# Patient Record
Sex: Female | Born: 1960 | Race: White | Hispanic: No | Marital: Married
Health system: Southern US, Community
[De-identification: ages and names within clinical notes are randomized; demographics above are authoritative.]

## PROBLEM LIST (undated history)

## (undated) DIAGNOSIS — I10 Essential (primary) hypertension: Secondary | ICD-10-CM

## (undated) HISTORY — PX: ABDOMINAL HYSTERECTOMY: SHX81

---

## 2004-11-01 ENCOUNTER — Ambulatory Visit: Payer: Self-pay | Admitting: Obstetrics and Gynecology

## 2005-11-04 ENCOUNTER — Ambulatory Visit: Payer: Self-pay | Admitting: Obstetrics and Gynecology

## 2005-11-07 ENCOUNTER — Ambulatory Visit: Payer: Self-pay | Admitting: Obstetrics and Gynecology

## 2006-11-06 ENCOUNTER — Ambulatory Visit: Payer: Self-pay | Admitting: Obstetrics and Gynecology

## 2007-11-10 ENCOUNTER — Ambulatory Visit: Payer: Self-pay | Admitting: Obstetrics and Gynecology

## 2007-11-13 ENCOUNTER — Ambulatory Visit: Payer: Self-pay | Admitting: Obstetrics and Gynecology

## 2008-11-11 ENCOUNTER — Ambulatory Visit: Payer: Self-pay | Admitting: Obstetrics and Gynecology

## 2008-12-20 ENCOUNTER — Ambulatory Visit: Payer: Self-pay | Admitting: Surgery

## 2009-11-22 ENCOUNTER — Ambulatory Visit: Payer: Self-pay | Admitting: Obstetrics and Gynecology

## 2010-04-20 ENCOUNTER — Ambulatory Visit: Payer: Self-pay | Admitting: General Practice

## 2010-04-24 LAB — PATHOLOGY REPORT

## 2010-11-28 ENCOUNTER — Ambulatory Visit: Payer: Self-pay | Admitting: Obstetrics and Gynecology

## 2011-11-29 ENCOUNTER — Ambulatory Visit: Payer: Self-pay | Admitting: Obstetrics and Gynecology

## 2012-12-10 ENCOUNTER — Ambulatory Visit: Payer: Self-pay | Admitting: Obstetrics and Gynecology

## 2013-01-07 ENCOUNTER — Ambulatory Visit: Payer: Self-pay | Admitting: Obstetrics and Gynecology

## 2013-03-08 ENCOUNTER — Ambulatory Visit: Payer: Self-pay | Admitting: Gastroenterology

## 2013-12-15 ENCOUNTER — Ambulatory Visit: Payer: Self-pay | Admitting: Obstetrics and Gynecology

## 2014-12-28 ENCOUNTER — Other Ambulatory Visit: Payer: Self-pay | Admitting: Obstetrics and Gynecology

## 2014-12-28 DIAGNOSIS — Z1231 Encounter for screening mammogram for malignant neoplasm of breast: Secondary | ICD-10-CM

## 2015-01-12 ENCOUNTER — Ambulatory Visit
Admission: RE | Admit: 2015-01-12 | Discharge: 2015-01-12 | Disposition: A | Discharge status: Home / Self Care | Payer: 59 | Source: Ambulatory Visit | Attending: Obstetrics and Gynecology | Admitting: Obstetrics and Gynecology

## 2015-01-12 DIAGNOSIS — Z1231 Encounter for screening mammogram for malignant neoplasm of breast: Secondary | ICD-10-CM | POA: Diagnosis present

## 2015-08-31 DIAGNOSIS — K279 Peptic ulcer, site unspecified, unspecified as acute or chronic, without hemorrhage or perforation: Secondary | ICD-10-CM | POA: Insufficient documentation

## 2015-08-31 DIAGNOSIS — Z9884 Bariatric surgery status: Secondary | ICD-10-CM | POA: Insufficient documentation

## 2015-11-07 DIAGNOSIS — Z9884 Bariatric surgery status: Secondary | ICD-10-CM | POA: Insufficient documentation

## 2015-11-07 DIAGNOSIS — K639 Disease of intestine, unspecified: Secondary | ICD-10-CM | POA: Insufficient documentation

## 2015-11-07 DIAGNOSIS — R140 Abdominal distension (gaseous): Secondary | ICD-10-CM | POA: Insufficient documentation

## 2016-01-02 ENCOUNTER — Other Ambulatory Visit: Payer: Self-pay | Admitting: Obstetrics and Gynecology

## 2016-01-02 DIAGNOSIS — Z1231 Encounter for screening mammogram for malignant neoplasm of breast: Secondary | ICD-10-CM

## 2016-02-07 ENCOUNTER — Ambulatory Visit
Admission: RE | Admit: 2016-02-07 | Discharge: 2016-02-07 | Disposition: A | Discharge status: Home / Self Care | Payer: 59 | Source: Ambulatory Visit | Attending: Obstetrics and Gynecology | Admitting: Obstetrics and Gynecology

## 2016-02-07 DIAGNOSIS — Z1231 Encounter for screening mammogram for malignant neoplasm of breast: Secondary | ICD-10-CM | POA: Diagnosis present

## 2016-08-07 ENCOUNTER — Encounter: Payer: Self-pay | Admitting: Podiatry

## 2016-08-07 ENCOUNTER — Ambulatory Visit (INDEPENDENT_AMBULATORY_CARE_PROVIDER_SITE_OTHER): Payer: 59

## 2016-08-07 ENCOUNTER — Ambulatory Visit (INDEPENDENT_AMBULATORY_CARE_PROVIDER_SITE_OTHER): Payer: 59 | Admitting: Podiatry

## 2016-08-07 DIAGNOSIS — M779 Enthesopathy, unspecified: Principal | ICD-10-CM

## 2016-08-07 DIAGNOSIS — M778 Other enthesopathies, not elsewhere classified: Secondary | ICD-10-CM

## 2016-08-07 DIAGNOSIS — M7751 Other enthesopathy of right foot: Secondary | ICD-10-CM

## 2016-08-07 DIAGNOSIS — Q828 Other specified congenital malformations of skin: Secondary | ICD-10-CM | POA: Diagnosis not present

## 2016-08-07 NOTE — Progress Notes (Signed)
   Subjective:    Patient ID: Hannah Freeman, female    DOB: 1960/07/26, 56 y.o.   MRN: [id number]  HPI: She presents today with a chief complaint of a painful plantar lateral right foot. She states that tiny callused area for a couple of months this been present there she's tried pulling at the area with tweezers thinking it was something in the foot possibly now she states that she's having pain along the entire lateral aspect of the foot she does a lot of walking for exercise and white have this taken care of.    Review of Systems  All other systems reviewed and are negative.      Objective:   Physical Exam: Vital signs are stable she is alert and oriented 3. Pulses are palpable. Neurologic symptoms from his intact. Deep tendon reflexes are intact. Strength was 5 over 5 dorsiflexion plantar flexion inverters everters all his with sutures intact. Orthopedic evaluation was resulted was distal to the ankle full range of motion without crepitation. Flexible pes planus is noted bilateral. Prominent fifth metatarsal bases bilateral. Fluctuance beneath the fifth metatarsal base of the right foot. She also has a reactive hyperkeratotic in this area which is painful on palpation. No open lesions or wounds are noted. Radiographs taken today do not demonstrate any type of osseus abnormalities other than of the pes planus.        Assessment & Plan:  Assessment: Porokeratosis bursitis and possible peroneal tendinitis right.  Plan: I injected the plantar bursa today with dexamethasone and local anesthetic debrided the reactive hyperkeratotic lesion and placed Cantharone under occlusion to be washed off thoroughly tomorrow. I'll follow-up with her in 1 month if she still painful.

## 2016-09-09 ENCOUNTER — Ambulatory Visit (INDEPENDENT_AMBULATORY_CARE_PROVIDER_SITE_OTHER): Payer: 59 | Admitting: Podiatry

## 2016-09-09 DIAGNOSIS — Q828 Other specified congenital malformations of skin: Secondary | ICD-10-CM | POA: Diagnosis not present

## 2016-09-09 NOTE — Progress Notes (Signed)
She resists today for follow-up of capsulitis to the plantar lateral and dorsolateral aspect of the right foot. She states this still hurts some and is feeling much better and the injection really helps that area.  Objective: Vital signs are stable she is alert and oriented 3 she has no pain on palpation or on range of motion of the fourth fifth met cuboid articulation of the right foot. She still retains superficial porokeratotic lesion which we placed chemicals on last visit in hopes to help remove the lesion. I was able to do nucleate the lesion today completely. This felt 100% better to her.  Assessment: Hypertrophic fifth metatarsal base right foot. Capsulitis fourth fifth metatarsocuboid articulation. An porokeratosis which has resolved.  Plan: Debrided the area today follow up with me as needed.

## 2017-01-03 DIAGNOSIS — R001 Bradycardia, unspecified: Secondary | ICD-10-CM | POA: Insufficient documentation

## 2017-01-03 DIAGNOSIS — R002 Palpitations: Secondary | ICD-10-CM | POA: Insufficient documentation

## 2017-01-03 DIAGNOSIS — R0789 Other chest pain: Secondary | ICD-10-CM | POA: Insufficient documentation

## 2017-01-06 ENCOUNTER — Other Ambulatory Visit: Payer: Self-pay | Admitting: Obstetrics and Gynecology

## 2017-01-06 DIAGNOSIS — N393 Stress incontinence (female) (male): Secondary | ICD-10-CM | POA: Insufficient documentation

## 2017-01-06 DIAGNOSIS — Z1231 Encounter for screening mammogram for malignant neoplasm of breast: Secondary | ICD-10-CM

## 2017-02-10 ENCOUNTER — Ambulatory Visit
Admission: RE | Admit: 2017-02-10 | Discharge: 2017-02-10 | Disposition: A | Discharge status: Home / Self Care | Payer: 59 | Source: Ambulatory Visit | Attending: Obstetrics and Gynecology | Admitting: Obstetrics and Gynecology

## 2017-02-10 DIAGNOSIS — Z1231 Encounter for screening mammogram for malignant neoplasm of breast: Secondary | ICD-10-CM

## 2018-01-05 ENCOUNTER — Other Ambulatory Visit: Payer: Self-pay | Admitting: Internal Medicine

## 2018-01-05 DIAGNOSIS — Z1231 Encounter for screening mammogram for malignant neoplasm of breast: Secondary | ICD-10-CM

## 2018-02-11 ENCOUNTER — Ambulatory Visit
Admission: RE | Admit: 2018-02-11 | Discharge: 2018-02-11 | Disposition: A | Discharge status: Home / Self Care | Payer: Managed Care, Other (non HMO) | Source: Ambulatory Visit | Attending: Internal Medicine | Admitting: Internal Medicine

## 2018-02-11 DIAGNOSIS — Z1231 Encounter for screening mammogram for malignant neoplasm of breast: Secondary | ICD-10-CM | POA: Diagnosis not present

## 2019-01-04 ENCOUNTER — Other Ambulatory Visit: Payer: Self-pay | Admitting: Internal Medicine

## 2019-01-04 DIAGNOSIS — Z1231 Encounter for screening mammogram for malignant neoplasm of breast: Secondary | ICD-10-CM

## 2019-01-26 DIAGNOSIS — I10 Essential (primary) hypertension: Secondary | ICD-10-CM | POA: Insufficient documentation

## 2019-02-15 ENCOUNTER — Ambulatory Visit
Admission: RE | Admit: 2019-02-15 | Discharge: 2019-02-15 | Disposition: A | Discharge status: Home / Self Care | Payer: Managed Care, Other (non HMO) | Source: Ambulatory Visit | Attending: Internal Medicine | Admitting: Internal Medicine

## 2019-02-15 DIAGNOSIS — Z1231 Encounter for screening mammogram for malignant neoplasm of breast: Secondary | ICD-10-CM | POA: Diagnosis not present

## 2019-03-24 ENCOUNTER — Other Ambulatory Visit: Payer: Self-pay

## 2019-03-24 ENCOUNTER — Encounter: Payer: Self-pay | Admitting: Podiatry

## 2019-03-24 ENCOUNTER — Ambulatory Visit: Payer: Managed Care, Other (non HMO) | Admitting: Podiatry

## 2019-03-24 ENCOUNTER — Ambulatory Visit (INDEPENDENT_AMBULATORY_CARE_PROVIDER_SITE_OTHER): Payer: Managed Care, Other (non HMO)

## 2019-03-24 DIAGNOSIS — Q828 Other specified congenital malformations of skin: Secondary | ICD-10-CM

## 2019-03-24 DIAGNOSIS — M19071 Primary osteoarthritis, right ankle and foot: Secondary | ICD-10-CM | POA: Diagnosis not present

## 2019-03-24 DIAGNOSIS — M6740 Ganglion, unspecified site: Secondary | ICD-10-CM

## 2019-03-24 NOTE — Progress Notes (Signed)
She presents today after having not seen her in a couple of years with a chief complaint of a painful blister to the dorsal aspect of the right toe.  She states that is just right behind the toenail.  She noticed it about 3 months ago and since then it hurts to wear closed toed shoes.  She has gets achy at times and she also has a couple painful calluses on the plantar aspect of the right foot.  Objective: Vital signs are stable alert and oriented x3.  Pulses are palpable.  There is no erythema edema cellulitis drainage or odor reactive hyper keratomas porokeratotic lesion subfifth metatarsal base and subfifth metatarsal head on the right foot.  No open lesions or wounds are noted.  She does have a small mucoid cyst on the dorsal aspect of the second toe at the level of the DIPJ just proximal to the eponychium.  Assessment: Osteoarthritis DIPJ second digit right.  Mucoid cyst.  Poor keratomas.  Plan: Chemical destruction after mechanical debridement of porokeratotic lesions under occlusion to be washed off thoroughly tomorrow.  I also discussed the need for surgical intervention regarding the second DIPJ.  I will follow-up with her in the near future for further discussion.

## 2019-03-31 ENCOUNTER — Ambulatory Visit (INDEPENDENT_AMBULATORY_CARE_PROVIDER_SITE_OTHER): Payer: Managed Care, Other (non HMO) | Admitting: Podiatry

## 2019-03-31 ENCOUNTER — Other Ambulatory Visit: Payer: Self-pay

## 2019-03-31 ENCOUNTER — Encounter: Payer: Self-pay | Admitting: Podiatry

## 2019-03-31 DIAGNOSIS — M6740 Ganglion, unspecified site: Secondary | ICD-10-CM | POA: Diagnosis not present

## 2019-03-31 DIAGNOSIS — M19071 Primary osteoarthritis, right ankle and foot: Secondary | ICD-10-CM | POA: Diagnosis not present

## 2019-03-31 NOTE — Patient Instructions (Signed)
Pre-Operative Instructions  Congratulations, you have decided to take an important step towards improving your quality of life.  You can be assured that the doctors and staff at [facility] will be with you every step of the way.  Here are some important things you should know:  1. Plan to be at the surgery center/hospital at least 1 (one) hour prior to your scheduled time, unless otherwise directed by the surgical center/hospital staff.  You must have a responsible adult accompany you, remain during the surgery and drive you home.  Make sure you have directions to the surgical center/hospital to ensure you arrive on time. 2. If you are having surgery at [facility] or [facility] hospitals, you will need a copy of your medical history and physical form from your family physician within one month prior to the date of surgery. We will give you a form for your primary physician to complete.  3. We make every effort to accommodate the date you request for surgery.  However, there are times where surgery dates or times have to be moved.  We will contact you as soon as possible if a change in schedule is required.   4. No aspirin/ibuprofen for one week before surgery.  If you are on aspirin, any non-steroidal anti-inflammatory medications (Mobic, Aleve, Ibuprofen) should not be taken seven (7) days prior to your surgery.  You make take Tylenol for pain prior to surgery.  5. Medications - If you are taking daily heart and blood pressure medications, seizure, reflux, allergy, asthma, anxiety, pain or diabetes medications, make sure you notify the surgery center/hospital before the day of surgery so they can tell you which medications you should take or avoid the day of surgery. 6. No food or drink after midnight the night before surgery unless directed otherwise by surgical center/hospital staff. 7. No alcoholic beverages 24-hours prior to surgery.  No smoking 24-hours prior or 24-hours after  surgery. 8. Wear loose pants or shorts. They should be loose enough to fit over bandages, boots, and casts. 9. Don't wear slip-on shoes. Sneakers are preferred. 10. Bring your boot with you to the surgery center/hospital.  Also bring crutches or a walker if your physician has prescribed it for you.  If you do not have this equipment, it will be provided for you after surgery. 11. If you have not been contacted by the surgery center/hospital by the day before your surgery, call to confirm the date and time of your surgery. 12. Leave-time from work may vary depending on the type of surgery you have.  Appropriate arrangements should be made prior to surgery with your employer. 13. Prescriptions will be provided immediately following surgery by your doctor.  Fill these as soon as possible after surgery and take the medication as directed. Pain medications will not be refilled on weekends and must be approved by the doctor. 14. Remove nail polish on the operative foot and avoid getting pedicures prior to surgery. 15. Wash the night before surgery.  The night before surgery wash the foot and leg well with water and the antibacterial soap provided. Be sure to pay special attention to beneath the toenails and in between the toes.  Wash for at least three (3) minutes. Rinse thoroughly with water and dry well with a towel.  Perform this wash unless told not to do so by your physician.  Enclosed: 1 Ice pack (please put in freezer the night before surgery)   1 Hibiclens skin cleaner     Pre-op instructions  If you have any questions regarding the instructions, please do not hesitate to call our office.  [place]: [address], [place], [place] [address] -- [phone]  [place]: [address] [place], [place] [address] -- [phone]  [place]: [address], [place], [place] [address] -- [phone]   Website: [email or web address] 

## 2019-03-31 NOTE — Progress Notes (Signed)
She presents today for surgical consult states that her right foot is doing much better with the exception of the toe that she would like to have fixed.  Still has a cyst on it and is still painful.  Objective: Vital signs are stable alert and oriented x3.  Pulses are palpable.  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  Assessment: Mucoid cyst mallet toe deformity.  Plan: Discussed surgical arthroplasty DIPJ with removal of cyst she understands this is amenable to it was signed [place] page of the consent form we did discuss the possible postop complications which may include but not limited to postop pain bleeding swelling infection recurrence need for further surgery overcorrection under correction loss of digit loss of limb loss of life.  Follow-up with her in the near future.

## 2019-04-02 ENCOUNTER — Telehealth: Payer: Self-pay | Admitting: Podiatry

## 2019-04-02 NOTE — Telephone Encounter (Signed)
DOS: 04/09/2019  SURGICAL PROCEDURES: Hammertoe Repair 2nd [phone]) and Exc. Mucoid Cyst Toe 2nd [phone]).  [employer] Effective: 05/27/2015  Deductible is $1,750 with $26.25 met and $1,723.75 remaining. Out of Pocket is $6,000 with $156.25 met and $5,843.75 remaining. CoInsurance is 80% / 20%.  Per Jenny Reichmann L no prior authorization is required. Call ref# [id number].

## 2019-04-07 ENCOUNTER — Other Ambulatory Visit: Payer: Self-pay | Admitting: Podiatry

## 2019-04-07 MED ORDER — OXYCODONE-ACETAMINOPHEN 10-325 MG PO TABS: 1.0000 | ORAL_TABLET | Freq: Three times a day (TID) | ORAL | 0 refills | Status: AC | PRN

## 2019-04-07 MED ORDER — CLINDAMYCIN HCL 150 MG PO CAPS: 150.0000 mg | ORAL_CAPSULE | Freq: Three times a day (TID) | ORAL | 0 refills | Status: DC

## 2019-04-07 MED ORDER — ONDANSETRON HCL 4 MG PO TABS: 4.0000 mg | ORAL_TABLET | Freq: Three times a day (TID) | ORAL | 0 refills | Status: AC | PRN

## 2019-04-09 ENCOUNTER — Encounter: Payer: Self-pay | Admitting: Podiatry

## 2019-04-09 DIAGNOSIS — M2041 Other hammer toe(s) (acquired), right foot: Secondary | ICD-10-CM

## 2019-04-09 DIAGNOSIS — M67471 Ganglion, right ankle and foot: Secondary | ICD-10-CM

## 2019-04-14 ENCOUNTER — Ambulatory Visit (INDEPENDENT_AMBULATORY_CARE_PROVIDER_SITE_OTHER): Payer: Managed Care, Other (non HMO) | Admitting: Podiatry

## 2019-04-14 ENCOUNTER — Ambulatory Visit (INDEPENDENT_AMBULATORY_CARE_PROVIDER_SITE_OTHER): Payer: Managed Care, Other (non HMO)

## 2019-04-14 ENCOUNTER — Other Ambulatory Visit: Payer: Self-pay

## 2019-04-14 DIAGNOSIS — Z9889 Other specified postprocedural states: Secondary | ICD-10-CM

## 2019-04-14 DIAGNOSIS — M205X1 Other deformities of toe(s) (acquired), right foot: Secondary | ICD-10-CM

## 2019-04-14 DIAGNOSIS — M6740 Ganglion, unspecified site: Secondary | ICD-10-CM

## 2019-04-14 NOTE — Progress Notes (Signed)
She presents today for first postop visit date of surgery 04/09/2019 hammertoe repair DIPJ with excision mucoid cyst.  States that is doing fine has minor pains and is no longer taking the pain medication.  Denies fever chills nausea vomiting muscle aches pains calf pain back pain chest pain shortness of breath.  Objective: Presents today dressed no dressing intact with Darco shoe once removed demonstrates toe is rectus sutures are intact no erythema edema cellulitis drainage or odor radiographs demonstrate complete arthroplasty.  Assessment: Arthroplasty second digit DIPJ with excision mucoid cyst.  Plan: Redressed today dressed a compressive dressing follow-up with her in 1 week for suture removal

## 2019-04-21 ENCOUNTER — Ambulatory Visit (INDEPENDENT_AMBULATORY_CARE_PROVIDER_SITE_OTHER): Payer: Managed Care, Other (non HMO) | Admitting: Podiatry

## 2019-04-21 ENCOUNTER — Other Ambulatory Visit: Payer: Self-pay

## 2019-04-21 DIAGNOSIS — Z9889 Other specified postprocedural states: Secondary | ICD-10-CM

## 2019-04-21 DIAGNOSIS — M6740 Ganglion, unspecified site: Secondary | ICD-10-CM

## 2019-04-21 DIAGNOSIS — M205X1 Other deformities of toe(s) (acquired), right foot: Secondary | ICD-10-CM

## 2019-04-21 NOTE — Progress Notes (Signed)
She presents today for second postop visit date of surgery April 09, 2019 status post excision mucoid cyst and DIPJ arthroplasty second digit of the right foot.  She denies fever chills nausea vomiting muscle aches and pains.  Objective: Dressed her dressing intact was removed demonstrates no erythema edema cellulitis drainage or odor went ahead remove the sutures today margins remain well coapted she is in good position.  Assessment: Well-healing surgical toe.  Plan: Follow-up with her in 2 weeks.  Demonstrated to her how to wrap the toe with Coban.

## 2019-05-05 ENCOUNTER — Ambulatory Visit (INDEPENDENT_AMBULATORY_CARE_PROVIDER_SITE_OTHER): Payer: Managed Care, Other (non HMO)

## 2019-05-05 ENCOUNTER — Ambulatory Visit (INDEPENDENT_AMBULATORY_CARE_PROVIDER_SITE_OTHER): Payer: Managed Care, Other (non HMO) | Admitting: Podiatry

## 2019-05-05 ENCOUNTER — Encounter: Payer: Self-pay | Admitting: Podiatry

## 2019-05-05 ENCOUNTER — Other Ambulatory Visit: Payer: Self-pay

## 2019-05-05 VITALS — Temp 96.8°F

## 2019-05-05 DIAGNOSIS — M6740 Ganglion, unspecified site: Secondary | ICD-10-CM

## 2019-05-05 DIAGNOSIS — M205X1 Other deformities of toe(s) (acquired), right foot: Secondary | ICD-10-CM | POA: Diagnosis not present

## 2019-05-05 DIAGNOSIS — Z9889 Other specified postprocedural states: Secondary | ICD-10-CM

## 2019-05-05 NOTE — Progress Notes (Signed)
She presents today date of surgery April 09, 2019 status post hammertoe repair DIPJ arthroplasty with excision mucoid cyst second toe right foot.  States that she is doing just fine no problems other than when she walks without her shoes.  She also goes on to say that her grandson stepped on her toe.  Objective: Vital signs are stable she alert oriented x3.  There is no erythema cellulitis drainage or odor is mild edema of the toe.  The toe is rectus in good position.  Radiographically it appears to be healing very nicely.  Assessment: Well-healing surgical toe second right.  Plan: Recommend that she continue to wrap it for the next couple of weeks and then discontinue and get back to her regular routine.  We will follow-up with her in 1 month if she has any issues otherwise as needed.

## 2019-05-19 ENCOUNTER — Encounter: Payer: Managed Care, Other (non HMO) | Admitting: Podiatry

## 2019-06-02 ENCOUNTER — Encounter: Payer: Self-pay | Admitting: Podiatry

## 2019-07-09 ENCOUNTER — Telehealth: Payer: Self-pay | Admitting: Podiatry

## 2019-07-09 NOTE — Telephone Encounter (Signed)
Patient had surgery in 04/09/2019 and is still having swelling and pain. She was just wondering if that should be still happening. Patient said she has left several messages and has not received a callback.

## 2019-07-28 ENCOUNTER — Encounter: Payer: Self-pay | Admitting: Podiatry

## 2019-07-28 ENCOUNTER — Other Ambulatory Visit: Payer: Self-pay

## 2019-07-28 ENCOUNTER — Ambulatory Visit (INDEPENDENT_AMBULATORY_CARE_PROVIDER_SITE_OTHER): Payer: Managed Care, Other (non HMO)

## 2019-07-28 ENCOUNTER — Ambulatory Visit: Payer: Managed Care, Other (non HMO) | Admitting: Podiatry

## 2019-07-28 DIAGNOSIS — Z9889 Other specified postprocedural states: Secondary | ICD-10-CM

## 2019-07-28 DIAGNOSIS — M205X1 Other deformities of toe(s) (acquired), right foot: Secondary | ICD-10-CM

## 2019-07-28 DIAGNOSIS — M6740 Ganglion, unspecified site: Secondary | ICD-10-CM

## 2019-07-28 NOTE — Progress Notes (Signed)
She presents today date of surgery April 09, 2019 status post hammertoe repair with excision mucoid cyst second toe right foot states that the toe still swells at times and sometimes it still hurts and turns different colors.  Objective: Vital signs are stable she is alert and oriented x3 the toe looks perfectly healthy today it is not painful and there is no crepitation and range of motion of the DIPJ.  Radiographs of the toe demonstrate that it appears to be sitting rectus there is a nice open DIP joint.  Assessment: Most likely autonomic disruption secondary to surgery resulting in the discoloration and color change.  Otherwise I think is just not done healing yet.  Plan: I encouraged her to keep the toe wrapped for another month or so just loosely around the tip of the toe I will follow-up with her on an as-needed basis if this is not resolving.  I told her to expect it to take anywhere from 6 to 12 months to resolve.

## 2019-11-18 ENCOUNTER — Other Ambulatory Visit: Payer: Self-pay | Admitting: Internal Medicine

## 2019-11-18 DIAGNOSIS — Z1231 Encounter for screening mammogram for malignant neoplasm of breast: Secondary | ICD-10-CM

## 2020-02-21 ENCOUNTER — Ambulatory Visit
Admission: RE | Admit: 2020-02-21 | Discharge: 2020-02-21 | Disposition: A | Discharge status: Home / Self Care | Payer: Managed Care, Other (non HMO) | Source: Ambulatory Visit | Attending: Internal Medicine | Admitting: Internal Medicine

## 2020-02-21 ENCOUNTER — Other Ambulatory Visit: Payer: Self-pay

## 2020-02-21 DIAGNOSIS — Z1231 Encounter for screening mammogram for malignant neoplasm of breast: Secondary | ICD-10-CM | POA: Diagnosis present

## 2020-09-08 ENCOUNTER — Other Ambulatory Visit: Payer: Self-pay | Admitting: Physician Assistant

## 2020-09-08 DIAGNOSIS — Z9884 Bariatric surgery status: Secondary | ICD-10-CM

## 2020-09-14 ENCOUNTER — Other Ambulatory Visit: Payer: Self-pay

## 2020-09-14 ENCOUNTER — Ambulatory Visit
Admission: RE | Admit: 2020-09-14 | Discharge: 2020-09-14 | Disposition: A | Discharge status: Home / Self Care | Payer: Managed Care, Other (non HMO) | Source: Ambulatory Visit | Attending: Physician Assistant | Admitting: Physician Assistant

## 2020-09-14 ENCOUNTER — Other Ambulatory Visit
Admission: RE | Admit: 2020-09-14 | Discharge: 2020-09-14 | Disposition: A | Discharge status: Home / Self Care | Payer: Managed Care, Other (non HMO) | Source: Home / Self Care | Attending: *Deleted | Admitting: *Deleted

## 2020-09-14 DIAGNOSIS — Z9884 Bariatric surgery status: Secondary | ICD-10-CM | POA: Insufficient documentation

## 2020-09-14 DIAGNOSIS — R1084 Generalized abdominal pain: Secondary | ICD-10-CM | POA: Insufficient documentation

## 2020-09-14 HISTORY — DX: Essential (primary) hypertension: I10

## 2020-09-14 LAB — BASIC METABOLIC PANEL
Anion gap: 7 (ref 5–15)
BUN: 19 mg/dL (ref 6–20)
CO2: 32 mmol/L (ref 22–32)
Calcium: 9.2 mg/dL (ref 8.9–10.3)
Chloride: 99 mmol/L (ref 98–111)
Creatinine, Ser: 0.71 mg/dL (ref 0.44–1.00)
GFR, Estimated: >60 mL/min (ref >60)
Glucose, Bld: 87 mg/dL (ref 70–99)
Potassium: 3.4 mmol/L — ABNORMAL LOW (ref 3.5–5.1)
Sodium: 138 mmol/L (ref 135–145)

## 2020-09-14 LAB — POCT I-STAT CREATININE: Creatinine, Ser: 0.8 mg/dL (ref 0.44–1.00)

## 2020-09-14 MED ORDER — IOHEXOL 300 MG/ML  SOLN
100.0000 mL | Freq: Once | INTRAMUSCULAR | Status: AC | PRN
  Administered 2020-09-14: 100 mL via INTRAVENOUS

## 2021-01-31 ENCOUNTER — Other Ambulatory Visit: Payer: Self-pay | Admitting: Internal Medicine

## 2021-01-31 DIAGNOSIS — Z1231 Encounter for screening mammogram for malignant neoplasm of breast: Secondary | ICD-10-CM

## 2021-04-03 ENCOUNTER — Other Ambulatory Visit: Payer: Self-pay

## 2021-04-03 ENCOUNTER — Ambulatory Visit
Admission: RE | Admit: 2021-04-03 | Discharge: 2021-04-03 | Disposition: A | Discharge status: Home / Self Care | Payer: Managed Care, Other (non HMO) | Source: Ambulatory Visit | Attending: Internal Medicine | Admitting: Internal Medicine

## 2021-04-03 DIAGNOSIS — Z1231 Encounter for screening mammogram for malignant neoplasm of breast: Secondary | ICD-10-CM | POA: Diagnosis present

## 2021-10-07 IMAGING — MG DIGITAL SCREENING BILAT W/ TOMO W/ CAD
8 series · 8 of 24 positions shown · non-contrast
Comparison: Previous exam(s).

CLINICAL DATA: Screening.

EXAM:
DIGITAL SCREENING BILATERAL MAMMOGRAM WITH TOMO AND CAD

[L MLO synth-2D]
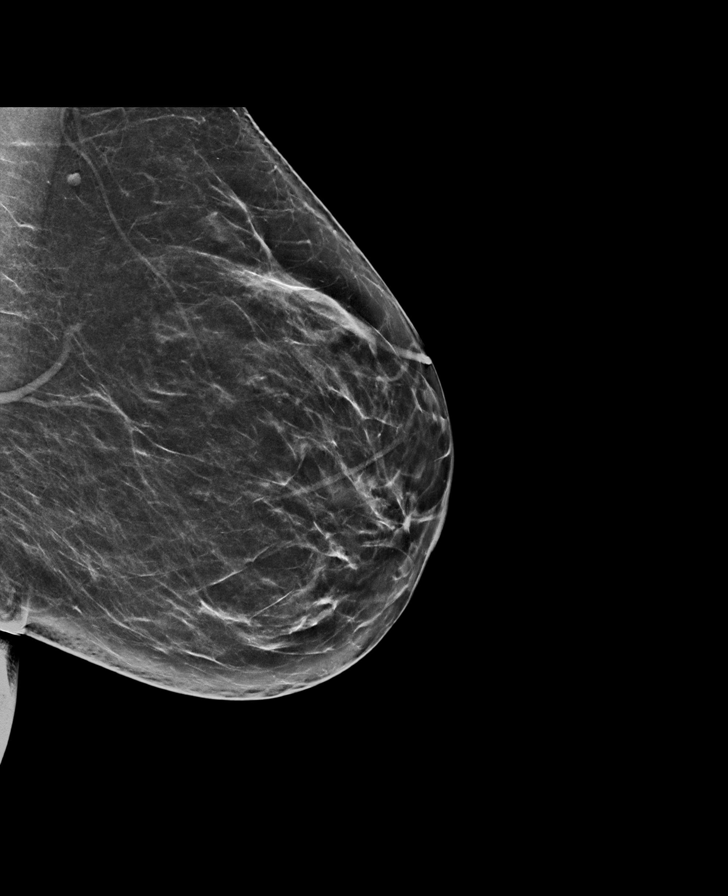

[R MLO synth-2D]
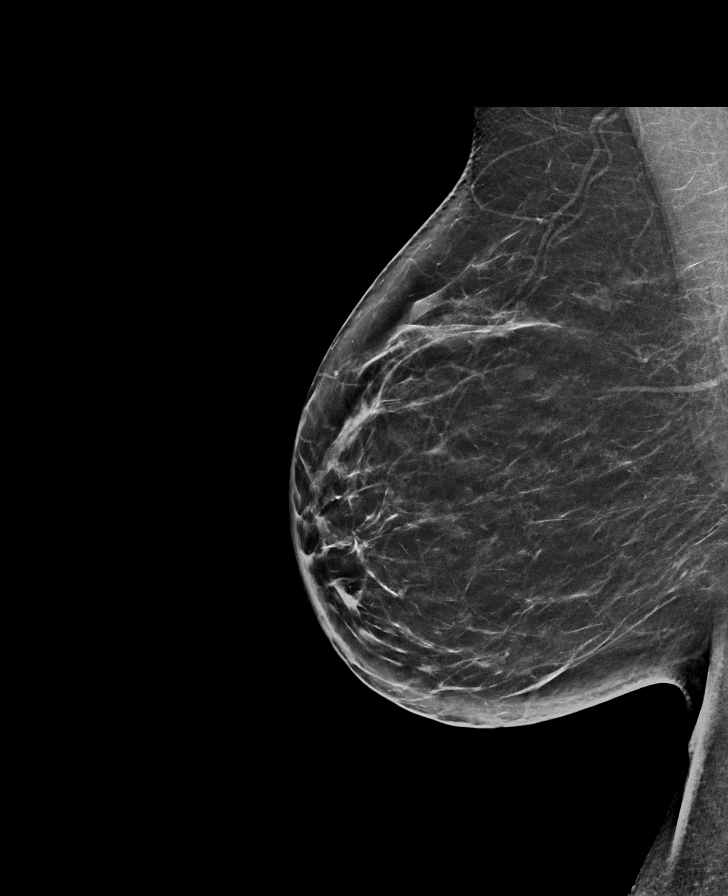

[R CC synth-2D]
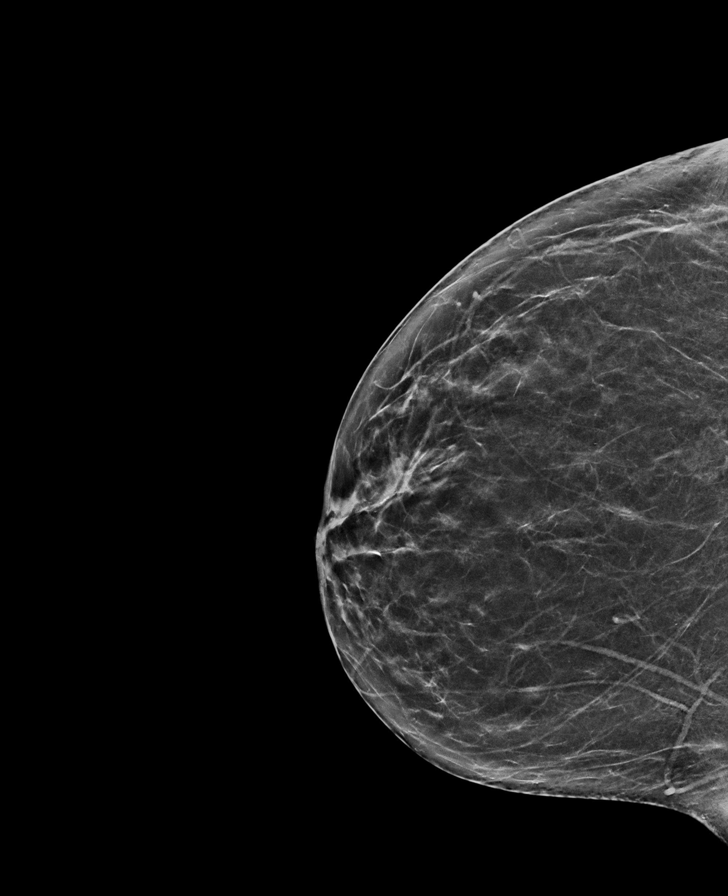

[L CC synth-2D]
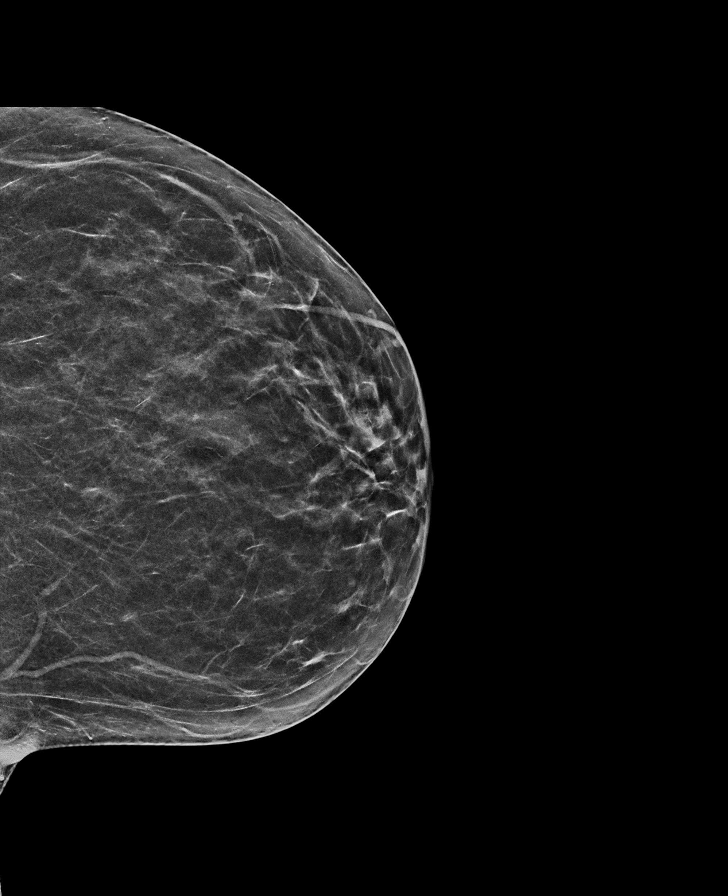

[L CC tomo · tomo slice 29/58.0]
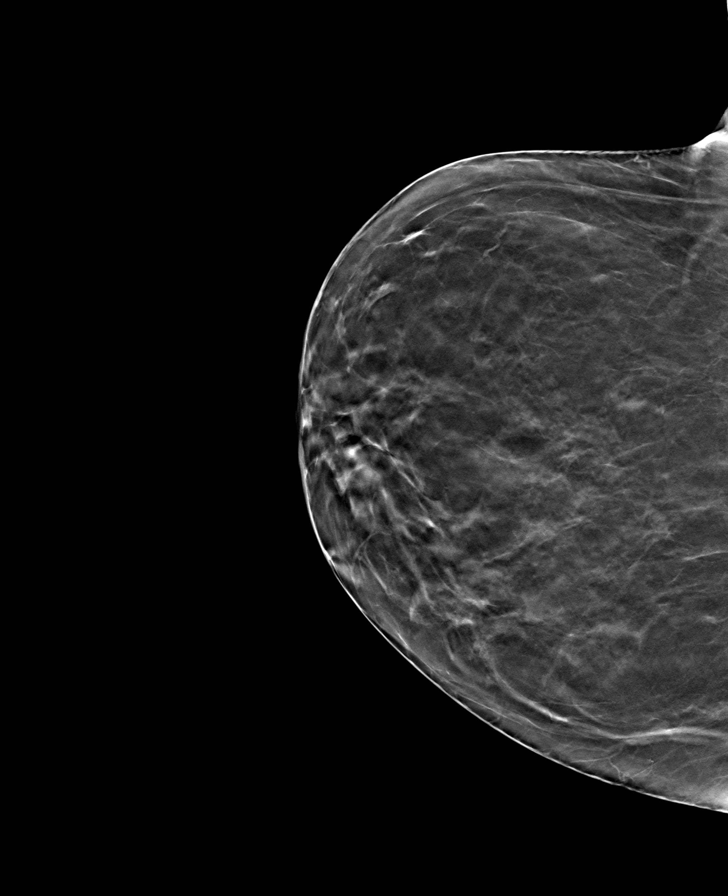

[R MLO tomo · tomo slice 37/72.0]
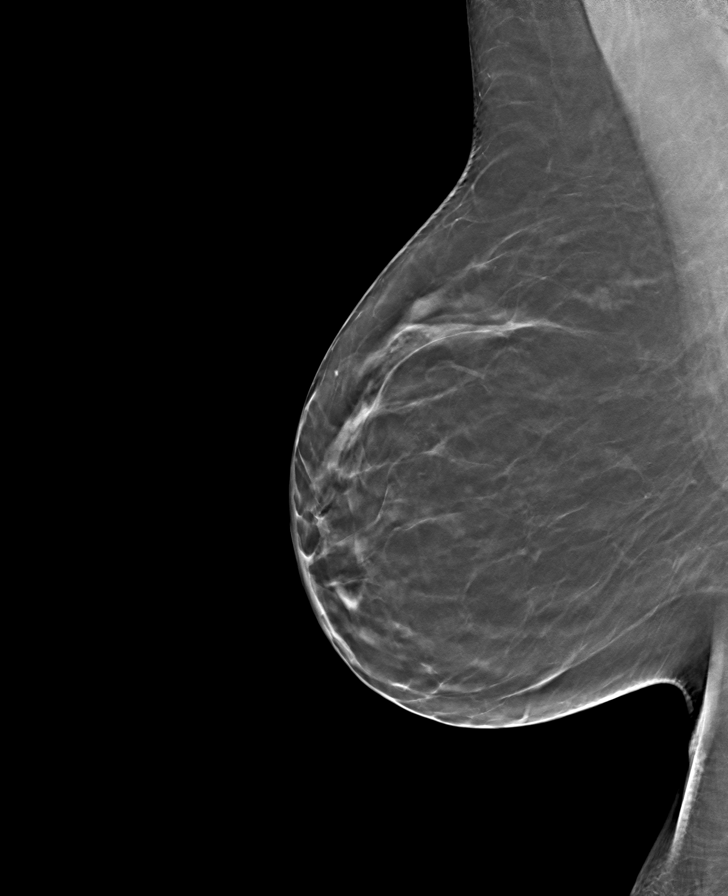

[R CC tomo · tomo slice 33/65.0]
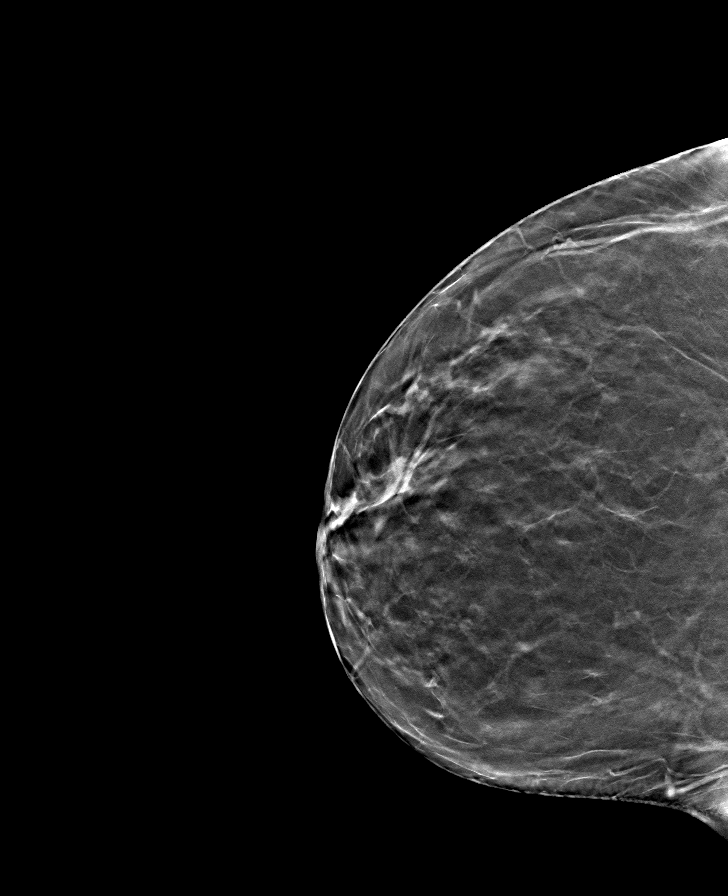

[L MLO tomo · tomo slice 36/71.0]
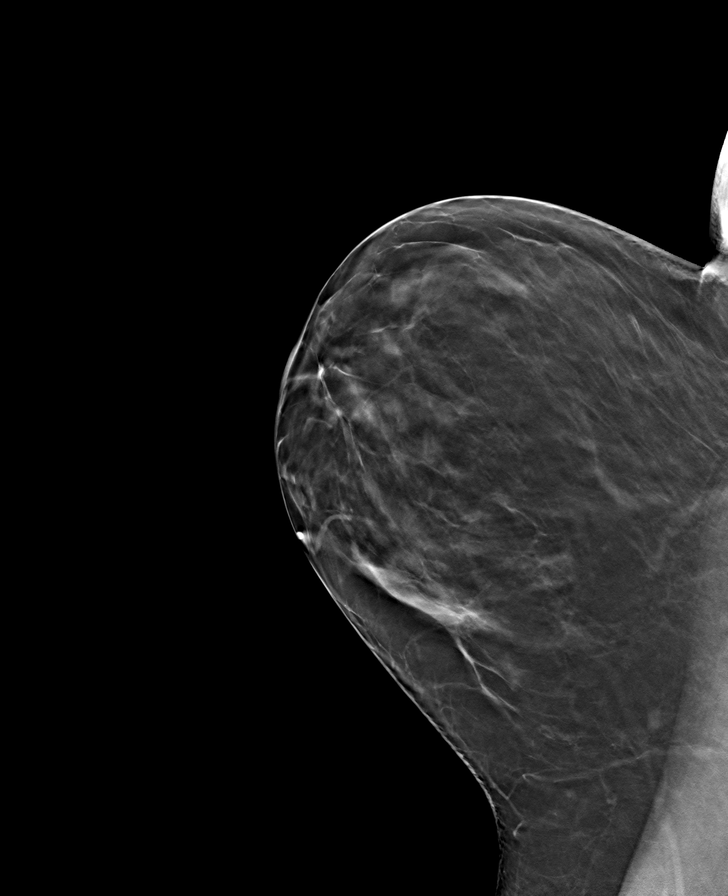

[8 of 24 positions shown; findings below may reference images not displayed]

ACR Breast Density Category b: There are scattered areas of
fibroglandular density.
FINDINGS: There are no findings suspicious for malignancy. Images were
processed with CAD.
IMPRESSION: No mammographic evidence of malignancy. A result letter of this
screening mammogram will be mailed directly to the patient.

RECOMMENDATION:
Screening mammogram in one year. (Code:[id number])

BI-RADS CATEGORY  1: Negative.

## 2022-03-20 ENCOUNTER — Other Ambulatory Visit: Payer: Self-pay | Admitting: Obstetrics and Gynecology

## 2022-03-20 DIAGNOSIS — Z1231 Encounter for screening mammogram for malignant neoplasm of breast: Secondary | ICD-10-CM

## 2022-04-09 ENCOUNTER — Ambulatory Visit
Admission: RE | Admit: 2022-04-09 | Discharge: 2022-04-09 | Disposition: A | Discharge status: Home / Self Care | Payer: Managed Care, Other (non HMO) | Source: Ambulatory Visit | Attending: Obstetrics and Gynecology | Admitting: Obstetrics and Gynecology

## 2022-04-09 DIAGNOSIS — Z1231 Encounter for screening mammogram for malignant neoplasm of breast: Secondary | ICD-10-CM

## 2022-12-11 ENCOUNTER — Other Ambulatory Visit: Payer: Self-pay | Admitting: Internal Medicine

## 2022-12-11 DIAGNOSIS — Z1231 Encounter for screening mammogram for malignant neoplasm of breast: Secondary | ICD-10-CM

## 2023-04-11 ENCOUNTER — Ambulatory Visit
Admission: RE | Admit: 2023-04-11 | Discharge: 2023-04-11 | Disposition: A | Discharge status: Home / Self Care | Payer: Managed Care, Other (non HMO) | Source: Ambulatory Visit | Attending: Internal Medicine | Admitting: Internal Medicine

## 2023-04-11 DIAGNOSIS — Z1231 Encounter for screening mammogram for malignant neoplasm of breast: Secondary | ICD-10-CM | POA: Diagnosis present

## 2023-05-19 ENCOUNTER — Ambulatory Visit

## 2023-05-19 DIAGNOSIS — Z83719 Family history of colon polyps, unspecified: Secondary | ICD-10-CM | POA: Diagnosis not present

## 2023-05-19 DIAGNOSIS — Z1211 Encounter for screening for malignant neoplasm of colon: Secondary | ICD-10-CM | POA: Diagnosis present

## 2023-09-08 ENCOUNTER — Ambulatory Visit (INDEPENDENT_AMBULATORY_CARE_PROVIDER_SITE_OTHER)

## 2023-09-08 ENCOUNTER — Ambulatory Visit: Admitting: Podiatry

## 2023-09-08 DIAGNOSIS — Q66222 Congenital metatarsus adductus, left foot: Secondary | ICD-10-CM | POA: Diagnosis not present

## 2023-09-08 DIAGNOSIS — M2041 Other hammer toe(s) (acquired), right foot: Secondary | ICD-10-CM

## 2023-09-08 DIAGNOSIS — M2042 Other hammer toe(s) (acquired), left foot: Secondary | ICD-10-CM

## 2023-09-08 NOTE — Progress Notes (Signed)
 Subjective:  Patient ID: Hannah Freeman, female    DOB: [birth year],  MRN: [id number] HPI Chief Complaint  Patient presents with   Hammer Toe    Rm7 pt says toes are crooked with some pain.  Mostly concerned that her foot is starting to splaying her toes 2 through 5 in the left foot are starting to drift toward the hallux.  States that the hallux is starting to drift as well.  She feels that the third toe is starting to go under the second toe.  She notices a large diastases between the 3rd and 4th toes.  No trauma.  States that her mother's feet look like this.  63 y.o. female presents with the above complaint.   ROS: Denies fever chills nausea vomiting muscle aches pains calf pain back pain chest pain shortness of breath.  Denies history of rheumatic disease.  Past Medical History:  Diagnosis Date   Hypertension    Past Surgical History:  Procedure Laterality Date   ABDOMINAL HYSTERECTOMY      Current Outpatient Medications:    Biotin 1 MG CAPS, biotin, Disp: , Rfl:    calcium carbonate (TUMS EX) 750 MG chewable tablet, Chew by mouth., Disp: , Rfl:    Calcium Citrate-Vitamin D 315-5 MG-MCG TABS, Take 2 tablets by mouth., Disp: , Rfl:    cloNIDine (CATAPRES) 0.1 MG tablet, Take by mouth daily., Disp: , Rfl:    ferrous sulfate 325 (65 FE) MG tablet, Iron (ferrous sulfate) 325 mg (65 mg iron) tablet   1 tablet every day by oral route., Disp: , Rfl:    hydrochlorothiazide (HYDRODIURIL) 25 MG tablet, Take 25 mg by mouth daily., Disp: , Rfl:    HYDROcodone bit-homatropine (HYCODAN) 5-1.5 MG/5ML syrup, , Disp: , Rfl:    IRON PO, Take by mouth., Disp: , Rfl:    meloxicam (MOBIC) 15 MG tablet, Take 15 mg by mouth daily., Disp: , Rfl:    meloxicam (MOBIC) 15 MG tablet, Take 1 tablet by mouth daily., Disp: , Rfl:    Multiple Vitamin (MULTI-VITAMINS) TABS, Take by mouth., Disp: , Rfl:    Na Sulfate-K Sulfate-Mg Sulfate concentrate (SUPREP) 17.5-3.13-1.6 GM/177ML SOLN, Take 354 mLs by mouth  as directed., Disp: , Rfl:    ondansetron  (ZOFRAN ) 4 MG tablet, Take 1 tablet (4 mg total) by mouth every 8 (eight) hours as needed., Disp: 20 tablet, Rfl: 0   propranolol (INDERAL) 10 MG tablet, propranolol 10 mg tablet, Disp: , Rfl:    WEGOVY 0.25 MG/0.5ML SOAJ, SMARTSIG:0.25 Milligram(s) SUB-Q Once a Week, Disp: , Rfl:    WEGOVY 0.5 MG/0.5ML SOAJ, Inject 0.5 mg into the skin once a week., Disp: , Rfl:    WEGOVY 1 MG/0.5ML SOAJ, Inject 1 mg into the skin once a week., Disp: , Rfl:    phentermine (ADIPEX-P) 37.5 MG tablet, , Disp: , Rfl:    topiramate (TOPAMAX) 50 MG tablet, , Disp: , Rfl:    venlafaxine XR (EFFEXOR-XR) 75 MG 24 hr capsule, Take 75 mg by mouth daily. (Patient not taking: Reported on 09/08/2023), Disp: , Rfl:   Allergies  Allergen Reactions   Penicillin G Hives   Penicillins Hives   Review of Systems Objective:  There were no vitals filed for this visit.  General: Well developed, nourished, in no acute distress, alert and oriented x3   Dermatological: Skin is warm, dry and supple bilateral. Nails x 10 are well maintained; remaining integument appears unremarkable at this time. There are no open sores,  no preulcerative lesions, no rash or signs of infection present.  Vascular: Dorsalis Pedis artery and Posterior Tibial artery pedal pulses are 2/4 bilateral with immedate capillary fill time. Pedal hair growth present. No varicosities and no lower extremity edema present bilateral.   Neruologic: Grossly intact via light touch bilateral. Vibratory intact via tuning fork bilateral. Protective threshold with Semmes Wienstein monofilament intact to all pedal sites bilateral. Patellar and Achilles deep tendon reflexes 2+ bilateral. No Babinski or clonus noted bilateral.   Musculoskeletal: No gross boney pedal deformities bilateral. No pain, crepitus, or limitation noted with foot and ankle range of motion bilateral. Muscular strength 5/5 in all groups tested bilateral.  She does  have significant medial deviation of toes 2 3 and to a lesser degree 4 and 5 the hallux also deviates medially as well.  All of the joints have a full range of motion and muscle strength appears normal.  Gait: Unassisted, Nonantalgic.    Radiographs:  Radiographs taken today demonstrate an osseously mature individual good bone mineralization appears to have a cyst in the fourth metatarsal proximally but rearfoot appears to be rectus with adducted forefoot and midfoot superimposed.  First metatarsal and hallux are in good alignment but adducted.  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  Plan: Refer her to Hannah Freeman for discussion regarding midfoot correction.     Hannah Freeman, [place]

## 2023-09-29 ENCOUNTER — Ambulatory Visit: Admitting: Podiatry

## 2023-09-29 DIAGNOSIS — M2041 Other hammer toe(s) (acquired), right foot: Secondary | ICD-10-CM

## 2023-09-29 DIAGNOSIS — M2042 Other hammer toe(s) (acquired), left foot: Secondary | ICD-10-CM | POA: Diagnosis not present

## 2023-09-29 DIAGNOSIS — Q66222 Congenital metatarsus adductus, left foot: Secondary | ICD-10-CM

## 2023-09-29 NOTE — Progress Notes (Signed)
  Subjective:  Patient ID: Hannah Freeman, female    DOB: Jun 29, 1960,  MRN: [id number]  Chief Complaint  Patient presents with   Foot Pain    Rm 7 Patient is here surgery consult-referred by Dr Verta    63 y.o. female presents with the above complaint. History confirmed with patient.  She presents for consultation of digital deformities she notes that her toes have started drifting over towards the big toe and the big toe drifts over little bit her mother had this and developed severe crossover toe deformity  Objective:  Physical Exam: warm, good capillary refill, no trophic changes or ulcerative lesions, normal DP and PT pulses, normal sensory exam, and flexible reducible digital contractures with adductus toe positioning   Radiographs: Multiple views x-ray of the left foot: Radiographs previously taken show digital contractures with adductus deformity and metatarsus adductus to the first second third with a large bony cyst in the proximal metaphysis of the third Assessment:   1. Hammertoes of both feet   2. Metatarsus adductus of left foot      Plan:  Patient was evaluated and treated and all questions answered. We reviewed her radiographs and my clinical exam findings and we discussed the presence of the deformities and surgical options to treat them.  She already wears wide shoes which have helped.  Currently not dealing with particular amounts of pain or functional issues but she would like to know long-term what the prognosis may be.  I do think there is time to allow this to continue to monitor and if it becomes more symptomatic or painful we can consider surgical intervention.  I do not think that complete correction of the metatarsus adductus with the 1st, 2nd and 3rd TMT fusion would be necessary at this point and actually may exacerbate the deformity, however distal metatarsal osteotomies and Akin osteotomy may alleviate this and help with toe position also could consider soft  tissue work with flexor tenotomy's prior to bony intervention.  Follow-up with me as needed if her symptoms worsen.  Return if symptoms worsen or fail to improve.

## 2024-01-26 ENCOUNTER — Other Ambulatory Visit: Payer: Self-pay | Admitting: Internal Medicine

## 2024-01-26 DIAGNOSIS — Z1231 Encounter for screening mammogram for malignant neoplasm of breast: Secondary | ICD-10-CM

## 2024-04-12 ENCOUNTER — Encounter
